# Patient Record
Sex: Female | Born: 1965 | Race: White | Hispanic: No | Marital: Single | Smoking: Current every day smoker
Health system: Southern US, Community
[De-identification: ages and names within clinical notes are randomized; demographics above are authoritative.]

## PROBLEM LIST (undated history)

## (undated) DIAGNOSIS — M5412 Radiculopathy, cervical region: Secondary | ICD-10-CM

## (undated) DIAGNOSIS — M545 Low back pain, unspecified: Secondary | ICD-10-CM

## (undated) DIAGNOSIS — F32A Depression, unspecified: Secondary | ICD-10-CM

## (undated) DIAGNOSIS — K219 Gastro-esophageal reflux disease without esophagitis: Secondary | ICD-10-CM

## (undated) DIAGNOSIS — N6459 Other signs and symptoms in breast: Secondary | ICD-10-CM

## (undated) DIAGNOSIS — F419 Anxiety disorder, unspecified: Secondary | ICD-10-CM

## (undated) DIAGNOSIS — R51 Headache: Secondary | ICD-10-CM

## (undated) DIAGNOSIS — F329 Major depressive disorder, single episode, unspecified: Secondary | ICD-10-CM

## (undated) DIAGNOSIS — E785 Hyperlipidemia, unspecified: Secondary | ICD-10-CM

## (undated) HISTORY — PX: ROTATOR CUFF REPAIR: SHX139

## (undated) HISTORY — DX: Radiculopathy, cervical region: M54.12

## (undated) HISTORY — DX: Low back pain: M54.5

## (undated) HISTORY — DX: Major depressive disorder, single episode, unspecified: F32.9

## (undated) HISTORY — PX: FOOT SURGERY: SHX648

## (undated) HISTORY — DX: Hyperlipidemia, unspecified: E78.5

## (undated) HISTORY — DX: Low back pain, unspecified: M54.50

## (undated) HISTORY — DX: Depression, unspecified: F32.A

---
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
## 1999-02-27 ENCOUNTER — Other Ambulatory Visit: Admission: RE | Admit: 1999-02-27 | Discharge: 1999-02-27 | Payer: Self-pay | Admitting: Obstetrics and Gynecology

## 2000-02-27 ENCOUNTER — Other Ambulatory Visit: Admission: RE | Admit: 2000-02-27 | Discharge: 2000-02-27 | Payer: Self-pay | Admitting: Obstetrics and Gynecology

## 2001-01-01 ENCOUNTER — Other Ambulatory Visit: Admission: RE | Admit: 2001-01-01 | Discharge: 2001-01-01 | Payer: Self-pay | Admitting: Obstetrics and Gynecology

## 2002-01-21 ENCOUNTER — Other Ambulatory Visit: Admission: RE | Admit: 2002-01-21 | Discharge: 2002-01-21 | Payer: Self-pay | Admitting: Obstetrics and Gynecology

## 2003-01-28 ENCOUNTER — Other Ambulatory Visit: Admission: RE | Admit: 2003-01-28 | Discharge: 2003-01-28 | Payer: Self-pay | Admitting: Obstetrics and Gynecology

## 2003-08-31 ENCOUNTER — Encounter: Payer: Self-pay | Admitting: Internal Medicine

## 2003-08-31 ENCOUNTER — Ambulatory Visit (HOSPITAL_COMMUNITY): Admission: RE | Admit: 2003-08-31 | Discharge: 2003-08-31 | Payer: Self-pay | Admitting: Internal Medicine

## 2003-09-27 ENCOUNTER — Ambulatory Visit (HOSPITAL_COMMUNITY): Admission: RE | Admit: 2003-09-27 | Discharge: 2003-09-27 | Payer: Self-pay | Admitting: Internal Medicine

## 2003-09-27 ENCOUNTER — Encounter: Payer: Self-pay | Admitting: Internal Medicine

## 2006-01-17 ENCOUNTER — Ambulatory Visit (HOSPITAL_COMMUNITY): Admission: RE | Admit: 2006-01-17 | Discharge: 2006-01-17 | Payer: Self-pay | Admitting: Family Medicine

## 2006-08-07 ENCOUNTER — Encounter: Admission: RE | Admit: 2006-08-07 | Discharge: 2006-08-07 | Payer: Self-pay | Admitting: Family Medicine

## 2008-03-24 ENCOUNTER — Ambulatory Visit (HOSPITAL_COMMUNITY): Admission: RE | Admit: 2008-03-24 | Discharge: 2008-03-24 | Payer: Self-pay | Admitting: Family Medicine

## 2009-03-29 ENCOUNTER — Ambulatory Visit (HOSPITAL_COMMUNITY): Admission: RE | Admit: 2009-03-29 | Discharge: 2009-03-29 | Payer: Self-pay | Admitting: Family Medicine

## 2009-04-28 ENCOUNTER — Ambulatory Visit: Payer: Self-pay | Admitting: Sports Medicine

## 2009-04-28 DIAGNOSIS — M545 Low back pain: Secondary | ICD-10-CM

## 2009-04-28 DIAGNOSIS — M76899 Other specified enthesopathies of unspecified lower limb, excluding foot: Secondary | ICD-10-CM

## 2009-05-24 ENCOUNTER — Telehealth: Payer: Self-pay | Admitting: Sports Medicine

## 2009-10-03 ENCOUNTER — Encounter: Admission: RE | Admit: 2009-10-03 | Discharge: 2009-10-03 | Payer: Self-pay | Admitting: Family Medicine

## 2009-11-29 ENCOUNTER — Ambulatory Visit: Payer: Self-pay | Admitting: Family Medicine

## 2009-11-29 DIAGNOSIS — M5412 Radiculopathy, cervical region: Secondary | ICD-10-CM | POA: Insufficient documentation

## 2009-12-24 HISTORY — PX: BONE MARROW BIOPSY: SHX199

## 2009-12-27 ENCOUNTER — Ambulatory Visit: Payer: Self-pay | Admitting: Sports Medicine

## 2010-06-09 ENCOUNTER — Encounter: Admission: RE | Admit: 2010-06-09 | Discharge: 2010-06-09 | Payer: Self-pay | Admitting: Family Medicine

## 2010-12-12 ENCOUNTER — Ambulatory Visit: Payer: Self-pay | Admitting: Sports Medicine

## 2011-01-23 NOTE — Assessment & Plan Note (Signed)
Summary: f/u neck, cerv radiculopathy   Vital Signs:  Patient profile:   45 year old female BP sitting:   119 / 83  Vitals Entered By: Enid Baas MD (December 27, 2009 3:47 PM)  History of Present Illness: 45 yo F here for 1 month f/u neck pain, radiculopathy  Patient reports 50-60% improvement on neurontin over past month Some shooting pain down arms but no more numbness/tingling No longer keeping her up at night Tramadol caused severe GI reaction so added as allergy. No pain with shoulder motions and feels strength has come back in arms Still with pain mostly in right side of neck now. Doing easy ROM exercises. Had x-rays at chiropractor but copies not available.  Allergies (verified): 1)  ! Tramadol Hcl  Physical Exam  General:  Well-developed,well-nourished,in no acute distress; alert,appropriate and cooperative throughout examination Msk:  Neck: No gross deformity - mild limitation of extension but otherwise FROM. TTP mild in right paraspinal cervical region and trapezius.  Minimal left paraspinal TTP. No midline/bony TTP.   Negative spurlings MSRs 2+ and equal in biceps, triceps, and brachioradialis tendons. No numbness currently in bilateral upper extremities  Bilateral Shoulders: FROM without pain. Strength 5/5 empty can and resisted int/ext rotation Bilateral upper extremities 5/5 strength.   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  Increase to two times a day and eventually three times a day at low dose so long as doesn't make her too sleepy - tolerating this well currently.  Cont easy motion.  F/u in 2 months.  Complete Medication List: 1)  Neurontin 300 Mg Caps (Gabapentin) .Marland Kitchen.. 1 cap by mouth two times a day - can increase to three times a day if needed 2)  Celexa 20 Mg Tabs (Citalopram  hydrobromide) 3)  Clonazepam 0.5 Mg Tbdp (Clonazepam) 4)  Promethazine Hcl 25 Mg Tabs (Promethazine hcl) .... Take one tab q6 nausea  Patient Instructions: 1)  Increase neurontin to 300mg  twice a day.  We can eventually go to three times a day if you're tolerating this. 2)  Continue with range of motion exercises of your neck daily. 3)  If you can get a disk copy of your x-rays, drop that off for [place]. 4)  Follow up with [place] in 2 months. Prescriptions: NEURONTIN 300 MG CAPS (GABAPENTIN) 1 cap by mouth two times a day - can increase to three times a day if needed  #90 x 1   Entered and Authorized by:   Norton Blizzard MD   Signed by:   Norton Blizzard MD on 12/27/2009   Method used:   Electronically to        [facility]. [phone]* (retail)       [address]       [place], [place]  [address]       Ph: [phone]       Fax: [phone]   RxID:   [id number]

## 2011-01-25 NOTE — Assessment & Plan Note (Signed)
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 Tramadol Hcl 2)  ! Flagyl 3)  ! 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 1 cap by mouth two times a day - can increase to three times a day if needed 2)  Celexa 20 Mg Tabs (Citalopram hydrobromide) 3)  Clonazepam 0.5 Mg Tbdp (Clonazepam) 4)  Promethazine Hcl 25 Mg Tabs (Promethazine hcl) .... Take one tab q6 nausea 5)  Amitriptyline Hcl 25 Mg Tabs (Amitriptyline hcl) .... Take one tab qhs Prescriptions: NEURONTIN 300 MG CAPS (GABAPENTIN) 1 cap by mouth two  times a day - can increase to three times a day if needed  #90 Capsule x 6   Entered by:   Rochele Pages RN   Authorized by:   Enid Baas MD   Signed by:   Rochele Pages RN on 12/12/2010   Method used:   Electronically to        [facility]. [phone]* (retail)       [address]       [place], [place]  [address]       Ph: [phone]       Fax: [phone]   RxID:   [id number] AMITRIPTYLINE HCL 25 MG TABS (AMITRIPTYLINE HCL) take one tab qhs  #30 Tablet x 12   Entered by:   Rochele Pages RN   Authorized by:   Enid Baas MD   Signed by:   Rochele Pages RN on 12/12/2010   Method used:   Electronically to        [facility]. [phone]* (retail)       [address]       [place], [place]  [address]       Ph: [phone]       Fax: [phone]   RxID:   [id number]  Orders Added: 1)  Est. Patient Level II [78469]

## 2011-09-04 ENCOUNTER — Ambulatory Visit (INDEPENDENT_AMBULATORY_CARE_PROVIDER_SITE_OTHER): Payer: BC Managed Care – PPO | Admitting: Family Medicine

## 2011-09-04 ENCOUNTER — Encounter: Payer: Self-pay | Admitting: Family Medicine

## 2011-09-04 VITALS — BP 118/80 | HR 89 | Temp 97.9°F | Ht 62.0 in | Wt 185.0 lb

## 2011-09-04 DIAGNOSIS — M25529 Pain in unspecified elbow: Secondary | ICD-10-CM

## 2011-09-04 DIAGNOSIS — M25522 Pain in left elbow: Secondary | ICD-10-CM

## 2011-09-04 NOTE — Patient Instructions (Signed)
You have lateral epicondylitis and developing radial tunnel syndrome. Try to avoid painful activities as much as possible. Ice the area 3-4 times a day for 15 minutes at a time. Aleve 2 tabs twice a day with food for pain and inflammation. Counterforce brace as directed can help unload area - wear this regularly if it provides you with relief. Wrist brace to help prevent wrist extension - wear as much as possible. Start physical therapy for home exercises, modalities up to 6 weeks. Consider injection for short term pain relief if the above is not helping. Surgery is a consideration if exercises and injection are not providing enough relief.

## 2011-09-06 ENCOUNTER — Encounter: Payer: Self-pay | Admitting: Family Medicine

## 2011-09-06 DIAGNOSIS — M25522 Pain in left elbow: Secondary | ICD-10-CM | POA: Insufficient documentation

## 2011-09-06 NOTE — Progress Notes (Signed)
  Subjective:    Patient ID: Gloria Haney, female    DOB: [birth year], 45 y.o.   MRN: [id number]  HPI 45 yo F here for left elbow pain.  Patient is right handed. Denies any acute injury or anything different that has contributed to this. States about 1 1/2 weeks ago started developing lateral left elbow pain radiating into forearm. Feels different than her cervical radiculopathy pain. Is associated with some numbness into all fingers dorsally. + night pain. Is on gabapentin and amitriptyline. Worse with certain wrist motions. No swelling or bruising. No prior problems with this elbow.  Past Medical History  Diagnosis Date  . Cervical radiculopathy   . Lumbago   . Hyperlipidemia   . Depression     No current outpatient prescriptions on file prior to visit.    History reviewed. No pertinent past surgical history.  Allergies  Allergen Reactions  . Metronidazole   . Sulfonamide Derivatives   . Tramadol Hcl     History   Social History  . Marital Status: Single    Spouse Name: N/A    Number of Children: N/A  . Years of Education: N/A   Occupational History  . Not on file.   Social History Main Topics  . Smoking status: Current Everyday Smoker -- 1.0 packs/day    Types: Cigarettes  . Smokeless tobacco: Not on file  . Alcohol Use: Not on file  . Drug Use: Not on file  . Sexually Active: Not on file   Other Topics Concern  . Not on file   Social History Narrative  . No narrative on file    No family history on file.  BP 118/80  Pulse 89  Temp(Src) 97.9 F (36.6 C) (Oral)  Ht 5\' 2"  (1.575 m)  Wt 185 lb (83.915 kg)  BMI 33.84 kg/m2  Review of Systems See HPI above.    Objective:   Physical Exam Gen: NAD  L elbow: No gross deformity, swelling, or bruising. TTP at lateral epicondyle and extensor mass.  No wrist or hand TTP. FROM elbow and wrist. Strength 5/5 with all elbow and wrist motions but pain on resisted wrist and 3rd digit extension at  elbow reproducing pain. Negative tinels at cubital, carpal, and radial tunnels. Subjective numbness dorsal hand and digits.     Assessment & Plan:  1. Left elbow pain - 2/2 lateral epicondylitis though she may have an element of radial tunnel as well.  Start with conservative treatment - PT, counterforce brace as well as wrist brace to help prevent wrist extension that contributes to her pain.  Aleve for pain and inflammation.  Consider cortisone injection, MRI/ultrasound if not improving as expected over the next 4-6 weeks.

## 2011-09-06 NOTE — Assessment & Plan Note (Signed)
2/2 lateral epicondylitis though she may have an element of radial tunnel as well.  Start with conservative treatment - PT, counterforce brace as well as wrist brace to help prevent wrist extension that contributes to her pain.  Aleve for pain and inflammation.  Consider cortisone injection, MRI/ultrasound if not improving as expected over the next 4-6 weeks.

## 2011-09-24 ENCOUNTER — Other Ambulatory Visit: Payer: Self-pay | Admitting: Obstetrics and Gynecology

## 2011-09-25 ENCOUNTER — Encounter (HOSPITAL_COMMUNITY): Payer: Self-pay | Admitting: *Deleted

## 2011-09-26 ENCOUNTER — Other Ambulatory Visit: Payer: Self-pay | Admitting: Obstetrics and Gynecology

## 2011-09-26 NOTE — H&P (Signed)
Gloria Haney, Gloria Haney               ACCOUNT NO.:  [id number]  MEDICAL RECORD NO.:  [id number]  LOCATION:  [facility]                         FACILITY:  [facility]  PHYSICIAN:  Lenoard Aden, M.D.DATE OF BIRTH:  [birth year]  DATE OF ADMISSION:  09/24/2011 DATE OF DISCHARGE:                             HISTORY & PHYSICAL   CHIEF COMPLAINT:  Menorrhagia.  HISTORY OF PRESENT ILLNESS:  A 45 year old G1, P1 with menometrorrhagia, normal ultrasound, normal labs for definitive therapy.  She has allergies to SULFA and FLAGYL.  Medications include Advair Diskus p.r.n., amitriptyline, clonazepam p.r.n., Paxil, promethazine p.r.n., simvastatin, Zymax, Ventolin, zonisamide.  Medical problems to include irregular periods as noted, migraine headaches, and asthma.  FAMILY HISTORY:  Contributory for brain cancer.  PHYSICAL EXAMINATION:  GENERAL:  She is a well-developed, well-nourished white female. VITAL SIGNS:  Height of 5 feet 2 inches, weight 187 pounds. HEENT:  Normal. NECK:  Supple.  Full range of motion. LUNGS:  Clear. HEART:  Regular rhythm. ABDOMEN:  Soft, nontender. PELVIC:  Uterus to be normal size, shape, and contour, anteflexed.  No adnexal masses. EXTREMITIES:  No cords. NEUROLOGIC:  Nonfocal. SKIN:  Intact.  IMPRESSION:  Menometrorrhagia for definitive therapy.  PLAN:  Proceed with NovaSure endometrial ablation, diagnostic hysteroscopy, D and C.  Risks of anesthesia, infection, bleeding, injury to abdominal organs, and need for repair was discussed, delayed versus immediate complications to include bowel and bladder injury noted.  The patient acknowledges and will proceed.     Lenoard Aden, M.D.     RJT/MEDQ  D:  09/26/2011  T:  09/26/2011  Job:  [id number]

## 2011-09-27 ENCOUNTER — Encounter (HOSPITAL_COMMUNITY): Payer: Self-pay | Admitting: Anesthesiology

## 2011-09-27 ENCOUNTER — Other Ambulatory Visit: Payer: Self-pay | Admitting: Obstetrics and Gynecology

## 2011-09-27 ENCOUNTER — Ambulatory Visit (HOSPITAL_COMMUNITY): Payer: BC Managed Care – PPO | Admitting: Anesthesiology

## 2011-09-27 ENCOUNTER — Encounter (HOSPITAL_COMMUNITY)
Admission: RE | Disposition: A | Discharge status: Home / Self Care | Payer: Self-pay | Source: Ambulatory Visit | Attending: Obstetrics and Gynecology

## 2011-09-27 ENCOUNTER — Ambulatory Visit (HOSPITAL_COMMUNITY)
Admission: RE | Admit: 2011-09-27 | Discharge: 2011-09-27 | Disposition: A | Discharge status: Home / Self Care | Payer: BC Managed Care – PPO | Source: Ambulatory Visit | Attending: Obstetrics and Gynecology | Admitting: Obstetrics and Gynecology

## 2011-09-27 DIAGNOSIS — N920 Excessive and frequent menstruation with regular cycle: Secondary | ICD-10-CM

## 2011-09-27 HISTORY — DX: Headache: R51

## 2011-09-27 HISTORY — DX: Anxiety disorder, unspecified: F41.9

## 2011-09-27 HISTORY — DX: Gastro-esophageal reflux disease without esophagitis: K21.9

## 2011-09-27 LAB — CBC
MCV: 92.9 fL (ref 78.0–100.0)
WBC: 13.9 10*3/uL — ABNORMAL HIGH (ref 4.0–10.5)

## 2011-09-27 SURGERY — DILATATION & CURETTAGE/HYSTEROSCOPY WITH NOVASURE ABLATION
Anesthesia: Choice | Site: Uterus | Wound class: Clean Contaminated

## 2011-09-27 MED ORDER — LACTATED RINGERS IV SOLN
INTRAVENOUS | Status: DC
  Administered 2011-09-27 (×2): via INTRAVENOUS

## 2011-09-27 MED ORDER — KETOROLAC TROMETHAMINE 60 MG/2ML IM SOLN
INTRAMUSCULAR | Status: DC | PRN
  Administered 2011-09-27: 30 mg via INTRAMUSCULAR

## 2011-09-27 MED ORDER — KETOROLAC TROMETHAMINE 30 MG/ML IJ SOLN
INTRAMUSCULAR | Status: DC | PRN
  Administered 2011-09-27: 30 mg via INTRAVENOUS

## 2011-09-27 MED ORDER — LIDOCAINE HCL (CARDIAC) 20 MG/ML IV SOLN
INTRAVENOUS | Status: DC | PRN
  Administered 2011-09-27: 100 mg via INTRAVENOUS

## 2011-09-27 MED ORDER — MIDAZOLAM HCL 5 MG/5ML IJ SOLN
INTRAMUSCULAR | Status: DC | PRN
  Administered 2011-09-27: 2 mg via INTRAVENOUS

## 2011-09-27 MED ORDER — PROPOFOL 10 MG/ML IV EMUL
INTRAVENOUS | Status: DC | PRN
  Administered 2011-09-27: 10 mg via INTRAVENOUS

## 2011-09-27 MED ORDER — MIDAZOLAM HCL 2 MG/2ML IJ SOLN
INTRAMUSCULAR | Status: AC
  Filled 2011-09-27: qty 2

## 2011-09-27 MED ORDER — OXYCODONE-ACETAMINOPHEN 5-325 MG PO TABS: 1.0000 | ORAL_TABLET | ORAL | Status: AC | PRN

## 2011-09-27 MED ORDER — GLYCOPYRROLATE 0.2 MG/ML IJ SOLN
INTRAMUSCULAR | Status: AC
  Filled 2011-09-27: qty 1

## 2011-09-27 MED ORDER — DEXAMETHASONE SODIUM PHOSPHATE 10 MG/ML IJ SOLN
INTRAMUSCULAR | Status: AC
  Filled 2011-09-27: qty 1

## 2011-09-27 MED ORDER — GLYCOPYRROLATE 0.2 MG/ML IJ SOLN
INTRAMUSCULAR | Status: DC | PRN
  Administered 2011-09-27: 0.1 mg via INTRAVENOUS

## 2011-09-27 MED ORDER — BUPIVACAINE HCL (PF) 0.25 % IJ SOLN
INTRAMUSCULAR | Status: DC | PRN
  Administered 2011-09-27: 20 mL

## 2011-09-27 MED ORDER — PROPOFOL 10 MG/ML IV EMUL
INTRAVENOUS | Status: AC
  Filled 2011-09-27: qty 50

## 2011-09-27 MED ORDER — LIDOCAINE HCL (CARDIAC) 20 MG/ML IV SOLN
INTRAVENOUS | Status: AC
  Filled 2011-09-27: qty 5

## 2011-09-27 MED ORDER — FENTANYL CITRATE 0.05 MG/ML IJ SOLN
INTRAMUSCULAR | Status: AC
  Filled 2011-09-27: qty 2

## 2011-09-27 MED ORDER — ONDANSETRON HCL 4 MG/2ML IJ SOLN
INTRAMUSCULAR | Status: AC
  Filled 2011-09-27: qty 2

## 2011-09-27 MED ORDER — LACTATED RINGERS IR SOLN
Status: DC | PRN
  Administered 2011-09-27: 3000 mL

## 2011-09-27 MED ORDER — DEXAMETHASONE SODIUM PHOSPHATE 4 MG/ML IJ SOLN
INTRAMUSCULAR | Status: DC | PRN
  Administered 2011-09-27: 10 mg via INTRAVENOUS

## 2011-09-27 MED ORDER — ONDANSETRON HCL 4 MG/2ML IJ SOLN
INTRAMUSCULAR | Status: DC | PRN
  Administered 2011-09-27: 4 mg via INTRAVENOUS

## 2011-09-27 MED ORDER — FENTANYL CITRATE 0.05 MG/ML IJ SOLN
INTRAMUSCULAR | Status: DC | PRN
  Administered 2011-09-27: 100 ug via INTRAVENOUS

## 2011-09-27 SURGICAL SUPPLY — 14 items
ABLATOR ENDOMETRIAL BIPOLAR (ABLATOR) ×2 IMPLANT
CATH ROBINSON RED A/P 16FR (CATHETERS) ×2 IMPLANT
CLOTH BEACON ORANGE TIMEOUT ST (SAFETY) ×2 IMPLANT
CONTAINER PREFILL 10% NBF 60ML (FORM) ×4 IMPLANT
GLOVE BIO SURGEON STRL SZ7.5 (GLOVE) ×4 IMPLANT
GOWN PREVENTION PLUS LG XLONG (DISPOSABLE) ×2 IMPLANT
GOWN PREVENTION PLUS XLARGE (GOWN DISPOSABLE) ×2 IMPLANT
NDL SPNL 22GX3.5 QUINCKE BK (NEEDLE) ×1 IMPLANT
NEEDLE SPNL 22GX3.5 QUINCKE BK (NEEDLE) ×2 IMPLANT
PACK HYSTEROSCOPY LF (CUSTOM PROCEDURE TRAY) ×2 IMPLANT
PAD PREP 24X48 CUFFED NSTRL (MISCELLANEOUS) ×2 IMPLANT
SYR TB 1ML 25GX5/8 (SYRINGE) ×2 IMPLANT
TOWEL OR 17X24 6PK STRL BLUE (TOWEL DISPOSABLE) ×4 IMPLANT
WATER STERILE IRR 1000ML POUR (IV SOLUTION) ×2 IMPLANT

## 2011-09-27 NOTE — Anesthesia Procedure Notes (Signed)
Procedure Name: LMA Insertion Date/Time: 09/27/2011 12:51 PM Performed by: Karleen Dolphin Pre-anesthesia Checklist: Patient identified, Patient being monitored, Emergency Drugs available, Timeout performed and Suction available Patient Re-evaluated:Patient Re-evaluated prior to inductionOxygen Delivery Method: Circle System Utilized Preoxygenation: Pre-oxygenation with 100% oxygen Intubation Type: IV induction LMA: LMA inserted LMA Size: 4.0 Number of attempts: 1 Placement Confirmation: positive ETCO2 and breath sounds checked- equal and bilateral Tube secured with: Tape Dental Injury: Teeth and Oropharynx as per pre-operative assessment

## 2011-09-27 NOTE — Op Note (Signed)
Gloria Haney, Gloria Haney               ACCOUNT NO.:  [id number]  MEDICAL RECORD NO.:  [id number]  LOCATION:  [facility]                          FACILITY:  [facility]  PHYSICIAN:  Lenoard Aden, M.D.DATE OF BIRTH:  [birth year]  DATE OF PROCEDURE:  09/27/2011 DATE OF DISCHARGE:  09/27/2011                              OPERATIVE REPORT   DESCRIPTION OF PROCEDURE:  After being apprised of risks of anesthesia, infection, bleeding, injury to abdominal organs, need for repair delayed versus immediate complications include bowel and bladder injury, possible need for repair, possible uterine perforation, need for repair, the patient was brought to the operating room where she was administered general anesthetic without complications, prepped and draped in usual sterile fashion, catheterized until the bladder was empty.  Exam under anesthesia reveals a small anteflexed uterus and no adnexal masses. Speculum placed.  Dilute paracervical block with a dilute Marcaine solution 20 mL total placed in the standard fashion.  After achieving adequate anesthesia, cervix was grasped using a single-tooth tenaculum and dilated easily up to a #23 Pratt dilator.  NovaSure measurements were taken.  Visualization was a normal endometrial cavity.  Bilateral normal tubal ostia.  No evidence of uterine perforation.  The NovaSure device was then opened after endometrial curettings were collected using sharp curettage in a 4 quadrant method.  The NovaSure device is set to a length of 6.5 and a width of 2.9, seated in the appropriate fashion. CO2 test was performed and was negative.  The NovaSure procedure was then initiated for 59 seconds for a completed ablation.  The device was then removed, inspected and found to be intact.  Revisualization hysteroscopically reveals endometrial cavity to be within normal limits, well ablated and no evidence of uterine perforation.  At this time, the patient is awakened and transferred to  recovery in good condition after all instruments were removed.     Lenoard Aden, M.D.     RJT/MEDQ  D:  09/27/2011  T:  09/27/2011  Job:  [id number]

## 2011-09-27 NOTE — Anesthesia Preprocedure Evaluation (Signed)
Anesthesia Evaluation  Name, MR# and DOB Patient awake  General Assessment Comment  Reviewed: Allergy & Precautions, H&P , NPO status , Patient's Chart, lab work & pertinent test results  Airway Mallampati: II TM Distance: >3 FB Neck ROM: Full    Dental No notable dental hx. 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    GI/Hepatic negative GI ROS Neg liver ROS    Endo/Other  Negative Endocrine ROS  Renal/GU negative Renal ROS  Genitourinary negative   Musculoskeletal negative musculoskeletal ROS (+)   Abdominal   Peds  Hematology negative hematology ROS (+)   Anesthesia Other Findings   Reproductive/Obstetrics negative OB ROS                           Anesthesia Physical Anesthesia Plan  ASA: II  Anesthesia Plan: General   Post-op Pain Management:    Induction: Intravenous  Airway Management Planned: LMA  Additional Equipment:   Intra-op Plan:   Post-operative Plan: Extubation in OR  Informed Consent: I have reviewed the patients History and Physical, chart, labs and discussed the procedure including the risks, benefits and alternatives for the proposed anesthesia with the patient or authorized representative who has indicated his/her understanding and acceptance.   Dental advisory given  Plan Discussed with: Anesthesiologist  Anesthesia Plan Comments:         Anesthesia Quick Evaluation

## 2011-09-27 NOTE — Op Note (Signed)
[name], Gloria Haney               ACCOUNT NO.:  [id number]  MEDICAL RECORD NO.:  [id number]  LOCATION:  [facility]                          [facility]ILITY:  [facility]  PHYSICIAN:  Lenoard Aden, M.D.DATE OF BIRTH:  1966-01-26  DATE OF PROCEDURE: DATE OF DISCHARGE:  09/27/2011                              OPERATIVE REPORT   PREOPERATIVE DIAGNOSIS:  Refractory dysfunctional uterine bleeding.  POSTOPERATIVE DIAGNOSIS:  Refractory dysfunctional uterine bleeding.  PROCEDURE:  Diagnostic gastroscopy, D and C, NovaSure endometrial ablation.  SURGEON:  Lenoard Aden, MD  ASSISTANT:  None.  ANESTHESIA:  General and local.  Dictation ended at this point.     Lenoard Aden, M.D.     RJT/MEDQ  D:  09/27/2011  T:  09/27/2011  Job:  [id number]

## 2011-09-27 NOTE — Progress Notes (Signed)
  No changes noted. Consent done. 

## 2011-09-27 NOTE — Transfer of Care (Signed)
Immediate Anesthesia Transfer of Care Note  Patient: Gloria Haney  Procedure(s) Performed:  DILATATION & CURETTAGE/HYSTEROSCOPY WITH NOVASURE ABLATION  Patient Location: PACU  Anesthesia Type: General  Level of Consciousness: awake, alert  and oriented  Airway & Oxygen Therapy: Patient Spontanous Breathing and Patient connected to nasal cannula oxygen  Post-op Assessment: Report given to PACU RN and Post -op Vital signs reviewed and stable  Post vital signs: Reviewed and stable  Complications: No apparent anesthesia complications

## 2011-09-27 NOTE — Op Note (Signed)
09/27/2011  1:13 PM  PATIENT:  Gloria Haney  45 y.o. female  PRE-OPERATIVE DIAGNOSIS:  Dysfunctional Uterine Bleeding(Refractory)  POST-OPERATIVE DIAGNOSIS:  Dysfunctional Uterine Bleeding  PROCEDURE:  Procedure(s): DILATATION & CURETTAGE/HYSTEROSCOPY WITH NOVASURE ABLATION  SURGEON:  Surgeon(s): Lenoard Aden, MD  PHYSICIAN ASSISTANT:   ASSISTANTS: none   ANESTHESIA:   local and general  ESTIMATED BLOOD LOSS: * No blood loss amount entered *   BLOOD ADMINISTERED:none  DRAINS: none   LOCAL MEDICATIONS USED:  MARCAINE 20CC  SPECIMEN:  Source of Specimen:  [facility]  DISPOSITION OF SPECIMEN:  PATHOLOGY  COUNTS:  YES  TOURNIQUET:  * No tourniquets in log *  DICTATION #:[id number]  PLAN OF CARE: DC home  PATIENT DISPOSITION:  PACU - hemodynamically stable.

## 2011-09-28 NOTE — Anesthesia Postprocedure Evaluation (Signed)
  Anesthesia Post-op Note  Patient: Gloria Haney  Procedure(s) Performed:  DILATATION & CURETTAGE/HYSTEROSCOPY WITH NOVASURE ABLATION  Patient Location: PACU  Anesthesia Type: General  Level of Consciousness: awake, alert  and oriented  Airway and Oxygen Therapy: Patient Spontanous Breathing  Post-op Pain: none  Post-op Assessment: Post-op Vital signs reviewed, Patient's Cardiovascular Status Stable, Respiratory Function Stable, Patent Airway, No signs of Nausea or vomiting and Pain level controlled  Post-op Vital Signs: Reviewed and stable  Complications: No apparent anesthesia complications

## 2011-11-13 ENCOUNTER — Ambulatory Visit: Payer: BC Managed Care – PPO | Admitting: Sports Medicine

## 2012-02-21 ENCOUNTER — Other Ambulatory Visit: Payer: Self-pay | Admitting: Sports Medicine

## 2012-02-22 ENCOUNTER — Other Ambulatory Visit: Payer: Self-pay | Admitting: *Deleted

## 2012-02-22 MED ORDER — AMITRIPTYLINE HCL 25 MG PO TABS: 25.0000 mg | ORAL_TABLET | Freq: Every day | ORAL | Status: DC

## 2012-02-22 MED ORDER — GABAPENTIN 300 MG PO CAPS: 300.0000 mg | ORAL_CAPSULE | Freq: Three times a day (TID) | ORAL | Status: DC

## 2012-02-22 NOTE — Progress Notes (Signed)
Refills sent to [facility] [address] per pt.  States she is still taking amitriptyline 25 mg qhs and gabapentin 300 mg bid-tid.  Advised her to schedule f/u in the next few months as Dr. Darrick Penna has not seen her since 11/2010- pt agreeable.

## 2012-05-28 ENCOUNTER — Ambulatory Visit (INDEPENDENT_AMBULATORY_CARE_PROVIDER_SITE_OTHER): Payer: BC Managed Care – PPO | Admitting: Sports Medicine

## 2012-05-28 VITALS — BP 105/73

## 2012-05-28 DIAGNOSIS — M5412 Radiculopathy, cervical region: Secondary | ICD-10-CM

## 2012-05-28 MED ORDER — GABAPENTIN 300 MG PO CAPS: 300.0000 mg | ORAL_CAPSULE | Freq: Three times a day (TID) | ORAL | Status: AC

## 2012-05-28 MED ORDER — AMITRIPTYLINE HCL 25 MG PO TABS: 25.0000 mg | ORAL_TABLET | Freq: Every day | ORAL | Status: DC

## 2012-05-28 NOTE — Assessment & Plan Note (Signed)
The patient seems to be totally stable from her left-sided radiculopathy that is thought to relate cervical spine DJD  We continued her on the same medications  If she continues doing so well she should consider whether she wants to wean the dose of gabapentin somewhat  I am happy to see her for this condition but if she wishes to have her medications refilled for this condition by her family physician she can return to see me as needed.

## 2012-05-28 NOTE — Progress Notes (Signed)
  Subjective:    Patient ID: Gloria Haney, female    DOB: 1966-11-16, 46 y.o.   MRN: [id number]  HPI  Patient enters for refill of her amitriptyline and gabapentin. She sees Dr. Meridee Score for primary care. She is generally been doing well but does take medicines for high cholesterol and for asthma. Since we treated her radicular symptoms she has almost no pain or tingling that goes into her trapezius or down into her arms. 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    Review of Systems     Objective:   Physical Exam  In no acute distress   posture is improved versus last visit and she has a normal head position  Full range of motion of the neck  Testing of C5-T1 reveals normal strength and normal sensation  Normal biceps and triceps reflexes      Assessment & Plan:

## 2012-08-07 ENCOUNTER — Ambulatory Visit: Payer: BC Managed Care – PPO | Admitting: Sports Medicine

## 2012-10-21 ENCOUNTER — Other Ambulatory Visit: Payer: Self-pay | Admitting: Sports Medicine

## 2013-03-11 ENCOUNTER — Encounter: Payer: Self-pay | Admitting: Sports Medicine

## 2013-03-11 ENCOUNTER — Ambulatory Visit (INDEPENDENT_AMBULATORY_CARE_PROVIDER_SITE_OTHER): Payer: BC Managed Care – PPO | Admitting: Sports Medicine

## 2013-03-11 VITALS — BP 120/89 | HR 112 | Ht 62.0 in | Wt 185.0 lb

## 2013-03-11 DIAGNOSIS — M25569 Pain in unspecified knee: Secondary | ICD-10-CM

## 2013-03-11 DIAGNOSIS — M25562 Pain in left knee: Secondary | ICD-10-CM

## 2013-03-11 MED ORDER — MELOXICAM 15 MG PO TABS: ORAL_TABLET | ORAL | Status: AC

## 2013-03-11 NOTE — Progress Notes (Signed)
  Subjective:    Patient ID: Gloria Haney, female    DOB: 09-20-1966, 47 y.o.   MRN: [id number]  HPI chief complaint: Left knee pain  47 year old female comes in today complaining of left knee pain. Pain has been present now for about 2 months. She does not recall any specific injury. She first noticed the knee popping with going up and down stairs and she developed an aching discomfort along the medial aspect of the knee shortly thereafter. She was seen at [facility] and x-rays of her knee were done. She was given a cortisone injection but that did not improve her pain. Her pain is worse with weightbearing but it is intermittent. She denies any locking of the knee. She's not noticed any swelling. No prior knee surgery. No associated numbness or tingling in the legs. The knee does want to give way from time to time.  Past medical history is reviewed Medications are reviewed Surgical history is significant for an excision of a bunionette off of the left foot Allergies are reviewed She smokes one pack of cigarettes a day, denies alcohol use    Review of Systems     Objective:   Physical Exam Well-developed, well-nourished. No acute distress. Awake alert and oriented x3. Vital signs are reviewed  Left knee: Full range of motion. No effusion. There is tenderness to palpation along the medial joint line with a positive Thessaly's. Pain but no popping with McMurray's. No tenderness along the lateral joint line. No patellofemoral crepitus. No pain with patellar compression. Knee is stable to valgus and varus stressing. Negative anterior drawer. Negative posterior drawer. Neurovascularly intact distally. She walks with a slight limp.  X-rays from [facility] dated 02/18/2013 are reviewed. There is a paucity of degenerative changes. Nothing acute.  [facility] ultrasound of the left knee: Limited views of the medial meniscus were obtained. There is a faint hyperechoic area  through the posterior horn which could suggest calcification from a previous injury. No discrete tear is seen.       Assessment & Plan:  1. Left knee pain likely secondary to degenerative medial meniscal tear  Although the ultrasound does not show a definitive tear clinically I am concerned she may have a degenerative medial meniscal tear. She is wanting to avoid surgery if possible. I recommended Mobic 15 mg daily with food for 7 days then when necessary. Body helix knee sleeve for compression. Home exercise program consisting of open chain quad and hamstring strengthening. Followup in 4 weeks. If symptoms persist we may need to consider merits of further diagnostic imaging to rule out an occult medial meniscal tear. Call with questions or concerns in the interim.

## 2013-04-13 ENCOUNTER — Ambulatory Visit: Payer: BC Managed Care – PPO | Admitting: Sports Medicine

## 2013-04-14 ENCOUNTER — Ambulatory Visit (INDEPENDENT_AMBULATORY_CARE_PROVIDER_SITE_OTHER): Payer: BC Managed Care – PPO | Admitting: Sports Medicine

## 2013-04-14 ENCOUNTER — Encounter: Payer: Self-pay | Admitting: Sports Medicine

## 2013-04-14 VITALS — BP 128/88 | HR 99 | Ht 62.0 in | Wt 185.0 lb

## 2013-04-14 DIAGNOSIS — M65839 Other synovitis and tenosynovitis, unspecified forearm: Secondary | ICD-10-CM

## 2013-04-14 DIAGNOSIS — M25569 Pain in unspecified knee: Secondary | ICD-10-CM

## 2013-04-14 DIAGNOSIS — M25562 Pain in left knee: Secondary | ICD-10-CM

## 2013-04-14 DIAGNOSIS — M659 Synovitis and tenosynovitis, unspecified: Secondary | ICD-10-CM

## 2013-04-14 NOTE — Progress Notes (Signed)
  Subjective:    Patient ID: Gloria Haney, female    DOB: [birth year], 47 y.o.   MRN: [id number]  HPI Patient comes in today for followup on her left knee. She is still experiencing intermittent medial sided left knee pain but feels like it is somewhat improved from her last visit. The body helix sleeve was a little too tight so she has modified it just a bit. He is wearing a bulky double hinged upright brace on top of it which is causing her some discomfort. She denies any swelling. Gets occasional popping and catching but admits it is nontender. An ultrasound of the left knee done at her last visit showed changes in the posterior horn of the medial meniscus which were concerning for a possible degenerative meniscal tear here.  She is also complaining of pain and swelling in the second MTP joint of her right hand. She was recently at the beach and denies any trauma but began to notice acute pain and swelling in the MCP joint of her index finger. It is not terribly painful. She denies prior occurrences. No fevers or chills.    Review of Systems     Objective:   Physical Exam Well-developed, well-nourished. No acute distress. Awake alert and oriented x3  Right hand: The second MTP joint demonstrates a mild amount of swelling. No erythema. Area is slightly tender to palpation. Patient is able to make a full fist without difficulty. There is no ecchymosis. Neurovascularly intact distally.  Left knee: Full motion. No effusion. There is tenderness to palpation along the medial joint line. Pain but no popping with McMurray's. Positive Thessaly's. Good joint stability. Neurovascularly intact distally. Walking without significant limp.      Assessment & Plan:  1. Persistent left knee pain likely secondary to degenerative medial meniscal tear 2. Right second MCP joint synovitis  Patient has recently returned from a trip to [facility] where she did quite a bit of walking. Despite that, she admits  that her left knee pain is somewhat improved. She will discontinue the double of right brace and instead we'll use a simple Ace wrap over top the modified body helix sleeve (which she has cut in half). She has not been very diligent in doing her home exercises so I have reiterated the importance of doing these. For the second MCP joint synovitis, I recommended that she resume her Mobic 15 mg daily for the next 3-4 days. If symptoms persist or worsen she will let me know. Otherwise, I will see her back in 4 weeks for reevaluation.

## 2013-05-11 ENCOUNTER — Ambulatory Visit (INDEPENDENT_AMBULATORY_CARE_PROVIDER_SITE_OTHER): Payer: BC Managed Care – PPO | Admitting: Sports Medicine

## 2013-05-11 VITALS — BP 131/92 | Ht 62.0 in | Wt 195.0 lb

## 2013-05-11 DIAGNOSIS — M25569 Pain in unspecified knee: Secondary | ICD-10-CM

## 2013-05-11 DIAGNOSIS — M25562 Pain in left knee: Secondary | ICD-10-CM

## 2013-05-11 NOTE — Progress Notes (Signed)
  Subjective:    Patient ID: Gloria Haney, female    DOB: 29-May-1966, 47 y.o.   MRN: [id number]  HPI Patient comes in today for followup on her left knee pain. Overall, her pain is about the same as it was on her previous visit but it is tolerable. He is very intermittent. No swelling. No mechanical symptoms. He continues to wear her compression sleeve on a when necessary basis and states that she is doing her home exercises.    Review of Systems     Objective:   Physical Exam Well-developed, well-nourished no acute distress  Left knee: Full range of motion. No effusion. Still some slight tenderness along the medial joint line but a negative McMurray's. No tenderness along the lateral joint line. Good ligamentous stability. Neurovascular intact distally. Walking without a limp.       Assessment & Plan:  1. Left knee pain likely secondary to degenerative medial meniscal tear  Patient symptoms are tolerable. She is not interested in anything aggressive such as arthroscopy. Therefore, I think we can hold on further diagnostic imaging. Prior x-rays showed a paucity of degenerative changes. She's had a single intra-articular cortisone injection as well as oral anti-inflammatories. Overall, her symptoms have improved. I explained to her that if symptoms worsen or begin to interfere with her quality of life we could always reconsider the merits of further diagnostic imaging to rule out a medial meniscal tear. Followup when necessary.

## 2014-10-14 ENCOUNTER — Ambulatory Visit: Payer: Self-pay | Admitting: Podiatry

## 2015-03-11 ENCOUNTER — Other Ambulatory Visit (HOSPITAL_COMMUNITY): Payer: Self-pay | Admitting: Family Medicine

## 2015-03-11 DIAGNOSIS — Z1231 Encounter for screening mammogram for malignant neoplasm of breast: Secondary | ICD-10-CM

## 2015-03-17 ENCOUNTER — Ambulatory Visit (HOSPITAL_COMMUNITY)
Admission: RE | Admit: 2015-03-17 | Discharge: 2015-03-17 | Disposition: A | Discharge status: Home / Self Care | Payer: BLUE CROSS/BLUE SHIELD | Source: Ambulatory Visit | Attending: Family Medicine | Admitting: Family Medicine

## 2015-03-17 DIAGNOSIS — Z1231 Encounter for screening mammogram for malignant neoplasm of breast: Secondary | ICD-10-CM | POA: Diagnosis present

## 2015-03-18 ENCOUNTER — Other Ambulatory Visit: Payer: Self-pay | Admitting: Family Medicine

## 2015-03-18 DIAGNOSIS — R928 Other abnormal and inconclusive findings on diagnostic imaging of breast: Secondary | ICD-10-CM

## 2015-03-23 ENCOUNTER — Ambulatory Visit
Admission: RE | Admit: 2015-03-23 | Discharge: 2015-03-23 | Disposition: A | Discharge status: Home / Self Care | Payer: BLUE CROSS/BLUE SHIELD | Source: Ambulatory Visit | Attending: Family Medicine | Admitting: Family Medicine

## 2015-03-23 DIAGNOSIS — R928 Other abnormal and inconclusive findings on diagnostic imaging of breast: Secondary | ICD-10-CM

## 2016-06-07 ENCOUNTER — Other Ambulatory Visit: Payer: Self-pay | Admitting: Family Medicine

## 2016-06-07 DIAGNOSIS — Z1231 Encounter for screening mammogram for malignant neoplasm of breast: Secondary | ICD-10-CM

## 2016-06-15 ENCOUNTER — Ambulatory Visit
Admission: RE | Admit: 2016-06-15 | Discharge: 2016-06-15 | Disposition: A | Discharge status: Home / Self Care | Payer: BLUE CROSS/BLUE SHIELD | Source: Ambulatory Visit | Attending: Family Medicine | Admitting: Family Medicine

## 2016-06-15 DIAGNOSIS — Z1231 Encounter for screening mammogram for malignant neoplasm of breast: Secondary | ICD-10-CM

## 2017-05-31 ENCOUNTER — Other Ambulatory Visit: Payer: Self-pay | Admitting: Family Medicine

## 2017-05-31 DIAGNOSIS — Z1231 Encounter for screening mammogram for malignant neoplasm of breast: Secondary | ICD-10-CM

## 2017-06-18 ENCOUNTER — Ambulatory Visit
Admission: RE | Admit: 2017-06-18 | Discharge: 2017-06-18 | Disposition: A | Discharge status: Home / Self Care | Payer: BLUE CROSS/BLUE SHIELD | Source: Ambulatory Visit | Attending: Family Medicine | Admitting: Family Medicine

## 2017-06-18 DIAGNOSIS — Z1231 Encounter for screening mammogram for malignant neoplasm of breast: Secondary | ICD-10-CM

## 2017-06-19 ENCOUNTER — Ambulatory Visit: Payer: BLUE CROSS/BLUE SHIELD

## 2017-07-17 ENCOUNTER — Ambulatory Visit
Admission: RE | Admit: 2017-07-17 | Discharge: 2017-07-17 | Disposition: A | Discharge status: Home / Self Care | Payer: BLUE CROSS/BLUE SHIELD | Source: Ambulatory Visit | Attending: Sports Medicine | Admitting: Sports Medicine

## 2017-07-17 ENCOUNTER — Ambulatory Visit (INDEPENDENT_AMBULATORY_CARE_PROVIDER_SITE_OTHER): Payer: BLUE CROSS/BLUE SHIELD | Admitting: Family Medicine

## 2017-07-17 VITALS — BP 118/82 | Ht 62.0 in | Wt 167.0 lb

## 2017-07-17 DIAGNOSIS — M25512 Pain in left shoulder: Secondary | ICD-10-CM | POA: Diagnosis not present

## 2017-07-17 NOTE — Progress Notes (Signed)
Chief complaint:  Left shoulder pain 1 month  History of present illness: Gloria Haney is a 51 year old Caucasian female, with past medical history notable for anxiety, depression, hyperlipidemia, GERD, and cervical radiculopathy, who presents to the sports medicine office today with chief complaint of left shoulder pain. She reports that symptoms have been present for approximately 1 month now. She does not report of any inciting incident, trauma, or injury to explain the pain. She reports pain on lateral aspect of her left shoulder and arm. She describes the pain as a "deep ache" and throbbing pain. She reports occasional numbness and tingling in the upper arm, but believes this is from her previous diagnosis of cervical radiculopathy. She does not report of any neck pain or elbow pain. She reports any type of shoulder abduction and external rotation are aggravating factors. She reports that she is on gabapentin 300 mg 3 times a day and amitriptyline 25 mg nightly for cervical radiculopathy, which has helped somewhat. She reports that she has tried Advil and Aleve, which has caused GI upset. She reports otherwise she has tried Tylenol, with slight improvement in symptoms. She has not reported any weakness in the left arm. She does not report of any warmth, erythema, ecchymosis, or effusion. She does report of popping, locking, and catching sensation. She has additionally tried Tylenol, with slight improvement in symptoms. She is right hand dominant. She does like to crochet. She does not report of any previous injury to her left shoulder. She does not report of any fevers, chills, or night sweats.   Past medical history: Anxiety Depression GERD Hyperlipidemia Cervical radiculopathy  Past surgical history: Foot surgery Wisdom tooth extraction  Family history: No reported family history of hypertension, type 2 diabetes, hyperlipidemia   Social history: She does report of current tobacco use, 1  pack per day, she does not report of any alcohol or illicit drug use   Allergies: Flagyl Sulfa Tramadol  Review of systems: As stated above  Physical exam: Vital signs reviewed and are documented in chart General: Alert, oriented, appears stated age, in no apparent distress HEENT: Moist oral mucosa Respiratory: Normal respirations, able to speak in full sentences Cardiac: Normal peripheral pulses, regular rate Integumentary: No rashes on visible skin Neurologic: Strength 4+/5 in left upper extremity with shoulder abduction and shoulder external range of motion testing, strength limited secondary to pain, otherwise strength 5/5 in bilateral upper extremity, sensation 2+ in bilateral upper extremity Psychiatric: Appropriate affect and mood Musculoskeletal: Inspection of left shoulder reveals no obvious deformity or atrophy, no warmth, erythema, ecchymosis, or effusion noted, she is tender to palpation along head of biceps, AC joint, and lateral aspect of left upper arm along biceps and triceps region, no pain and clavicle and and cervical spine, she does have good range of motion, with forward flexion and abduction up to 180, but do notice pain when she gets to 90 of forward flexion and abduction, she does have intact shoulder internal range of motion, external ROM is at 70, impingement testing with Ananias PilgrimHawkins, Neers, and cross arm, O'Brien, speed's test positive, Yergason negative  Assessment and plan: 1. Left shoulder pain, suspect rotator cuff tendinopathy versus partial rotator cuff tear versus rotator cuff impingement   Left shoulder pain -She does have pain with shoulder abduction and shoulder external range of motion, heightening concern for supraspinatus and infraspinatus pathology, specifically tendinopathy versus partial tearing -She could also have rotator cuff impingement, as she is also somewhat sore along the [facility] -I  discussed she does need x-ray for further evaluation,  will order for x-ray to include AP, axillary, and scapular Y view -I discussed avoidance of NSAIDs, as this does cause GI upset, will not prescribe prednisone at this time, discussed she can use Tylenol as needed for pain in the interim -Did discuss home physical therapy exercises for her to do at home  Will have patient follow-up in one week, will have ultrasound session then, and potentially have steroid injection under ultrasound guidance. Otherwise, she will return here sooner as needed.  Addendum: X-rays were obtained and were personally reviewed by me, no obvious bony deformity seen in the [facility] and glenoid region, no obvious bone spurring for any osteoarthritic changes seen. Did call patient and made her aware of results. She will follow-up for ultrasound next week.  Haynes Kernshristopher Nakhi Choi, MD Primary Care Sports Medicine Fellow [facility]

## 2017-07-24 ENCOUNTER — Ambulatory Visit (INDEPENDENT_AMBULATORY_CARE_PROVIDER_SITE_OTHER): Payer: BLUE CROSS/BLUE SHIELD | Admitting: Family Medicine

## 2017-07-24 VITALS — BP 128/88 | Ht 62.0 in | Wt 166.0 lb

## 2017-07-24 DIAGNOSIS — M7542 Impingement syndrome of left shoulder: Secondary | ICD-10-CM

## 2017-07-24 NOTE — Progress Notes (Signed)
Procedure note: Gloria Haney is a 51 year old Caucasian female, with past medical history notable for anxiety, depression, hyperlipidemia, GERD, cervical radiculopathy, who presents to the sports medicine office today for ultrasound session of her left shoulder. She was seen in the office here last week for initial evaluation for left shoulder pain. Please see office note from 07/17/17 for further details of history. In the interim, she reports having 30% improvement in symptoms in the left shoulder. She reports that she is not able to take any type of NSAIDs, due to GI upset. She has been taking Tylenol intermittently, which helps slightly. She reports no interval changes in her symptoms otherwise. Still reports the pain on lateral aspect of her left shoulder and arm, aggravated by shoulder abduction and external range of motion. She describes the pain as a deep ache. She does not report of any numbness, tingling, or weakness. Did evaluate left shoulder under ultrasound today in the office. Biceps tendon, acromioclavicular joint, subscapularis, supraspinatus, infraspinatus, and glenohumeral joint were seen, without any obvious deformity, tearing, spurring, or hypoechoic changes to be concern for partial tear or a degenerative process. Mushroom sign was seen on evaluation of acromioclavicular joint, raising suspicion of possible rotator cuff impingement. Dynamic testing was done of supraspinatus, which does show catching of the supraspinatus underneath the acromion, confirming suspicion of rotator cuff impingement.  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 Discussed first option of continuing with home exercise program for her left shoulder, as this is the minstay of treatment and therapy. Given that she did have interval 30% improvement in symptoms, discussed good option would be to continue with just this alone.  Also discussed option of cortisone injection to the subacromial bursa, as well as option of formal physical therapy. Discussed at this time there is no need for any additional imaging, including MRI, as this will not change current management. After discussing and weighing options, ultimately shared agreement was made to continue with just home exercise program. She will return to the office in 3 weeks for reevaluation. If her pain does not improve in the next 3 weeks, will plan for cortisone injection at that time. If she needs to be seen sooner secondary to pain, will see her sooner and plan for subacromial cortisone injection then.  Haynes Kernshristopher Lake, M.D. Primary Care Sports Medicine Fellow [facility]

## 2017-08-07 ENCOUNTER — Ambulatory Visit (INDEPENDENT_AMBULATORY_CARE_PROVIDER_SITE_OTHER): Payer: BLUE CROSS/BLUE SHIELD | Admitting: Family Medicine

## 2017-08-07 ENCOUNTER — Encounter: Payer: Self-pay | Admitting: Family Medicine

## 2017-08-07 VITALS — BP 130/80 | Ht 62.0 in | Wt 166.0 lb

## 2017-08-07 DIAGNOSIS — M7542 Impingement syndrome of left shoulder: Secondary | ICD-10-CM

## 2017-08-07 MED ORDER — METHYLPREDNISOLONE ACETATE 40 MG/ML IJ SUSP
40.0000 mg | Freq: Once | INTRAMUSCULAR | Status: AC
  Administered 2017-08-07: 40 mg via INTRA_ARTICULAR

## 2017-08-07 NOTE — Progress Notes (Signed)
Chief complaint: Left shoulder pain 2 months  History of present illness: Gloria Haney is a 51 year old female who presents to the sports medicine office today with chief complaint of acute worsening of left shoulder pain. She reports that a few days ago she was cutting down some limbs from a tree and started to have acute pain in her left shoulder. She still reports having a deep throbbing pain along the lateral aspect of her left shoulder and arm. She reports to having occasional numbness and tingling in her left arm. She reports that she is not able to do physical therapy secondary to pain becoming worse. She cannot take any NSAIDs due to history of GI upset. She has been taking Tylenol which has not helped her. At last office visit on 07/24/17 Dr. Margaretha Sheffieldraper and I did do ultrasound imaging in the office, where dynamic testing was done the supraspinatus which did show catching of the supraspinatus underneath the acromion, confirming suspicion of rotator cuff impingement. She was given option of cortisone injection last time but she ultimately opted just to do home exercise program and was given option to return sooner if she decided on cortisone injection. She is interested today in cortisone injection into the left subacromial bursa.  Review of systems:  As stated above  Physical exam: Vital signs are reviewed and are documented in the chart Gen.: Alert, oriented, appears stated age, in no apparent distress HEENT: Moist oral mucosa Respiratory: Normal respirations, able to speak in full sentences Cardiac: Regular rate, distal pulses 2+ Integumentary: No rashes on visible skin:  Neurologic: Strength 4+/5 in left upper extremity mainly with all rotator cuff strength testing, otherwise strength 5/5 in bilateral upper extremity, sensation 2+ in bilateral upper extremity Psych: Normal affect, mood is described as good Musculoskeletal:  Inspection of left shoulder reveals no obvious deformity or muscle  atrophy, no warmth, erythema, ecchymosis, or effusion noted, she is tender today along the superior aspect of left shoulder along the distal clavicle and acromioclavicular joint, as well as the lateral aspect of left upper arm along biceps and triceps, no pain today along proximal clavicle and mid clavicular region, no pain and cervical spine, has pain with forward flexion and abduction, specifically when she gets to 90, but is able to get up fully to 180, external range of motion is at 50, internal range of motion is at 60, impingement testing positive with Hawkins, Neer, cross arm negative  Procedure note: Prior to procedure, written informed consent was obtained, outlining benefits and risks, with risks including bleeding, infection, and steroid flare. After informed consent signed by the patient, Betadine was used to clean off the skin. Alcohol was additionally used. Using 25-gauge needle 3-1 mixture of lidocaine and Depo-Medrol was used to inject the subacromial bursa from posteriolateral approach, with injection site 1 cm inferior and medial to posterior acromion. No complications were noted. Band-Aid was applied afterward. Patient was observed for 5-10 minutes following injection.  Assessment and plan: 1. Left rotator cuff impingement of supraspinatus underneath the acromion  Left rotator cuff impingement -Discussed plan of cortisone injection to the left subacromial bursa, she is agreeable to injection today -No complications noted from procedure  Discussed will have patient follow-up in 4 weeks, if she does not have any interval improvement in symptoms will plan on getting MRI of her left shoulder. Otherwise, she will return sooner as needed.   Gloria Haney, M.D. Primary Care Sports Medicine Fellow [facility]

## 2017-08-21 ENCOUNTER — Ambulatory Visit: Payer: BLUE CROSS/BLUE SHIELD | Admitting: Family Medicine

## 2017-09-04 ENCOUNTER — Ambulatory Visit: Payer: BLUE CROSS/BLUE SHIELD | Admitting: Family Medicine

## 2018-08-06 ENCOUNTER — Other Ambulatory Visit: Payer: Self-pay | Admitting: Family Medicine

## 2018-08-06 DIAGNOSIS — Z1231 Encounter for screening mammogram for malignant neoplasm of breast: Secondary | ICD-10-CM

## 2018-09-01 ENCOUNTER — Ambulatory Visit
Admission: RE | Admit: 2018-09-01 | Discharge: 2018-09-01 | Disposition: A | Discharge status: Home / Self Care | Payer: BLUE CROSS/BLUE SHIELD | Source: Ambulatory Visit | Attending: Family Medicine | Admitting: Family Medicine

## 2018-09-01 DIAGNOSIS — Z1231 Encounter for screening mammogram for malignant neoplasm of breast: Secondary | ICD-10-CM

## 2018-09-01 HISTORY — DX: Other signs and symptoms in breast: N64.59

## 2019-05-03 IMAGING — MG DIGITAL SCREENING BILATERAL MAMMOGRAM WITH TOMO AND CAD
8 series · 8 of 24 positions shown · non-contrast
Comparison: Previous exam(s).

CLINICAL DATA: Screening.

EXAM:
DIGITAL SCREENING BILATERAL MAMMOGRAM WITH TOMO AND CAD

[L CC synth-2D]
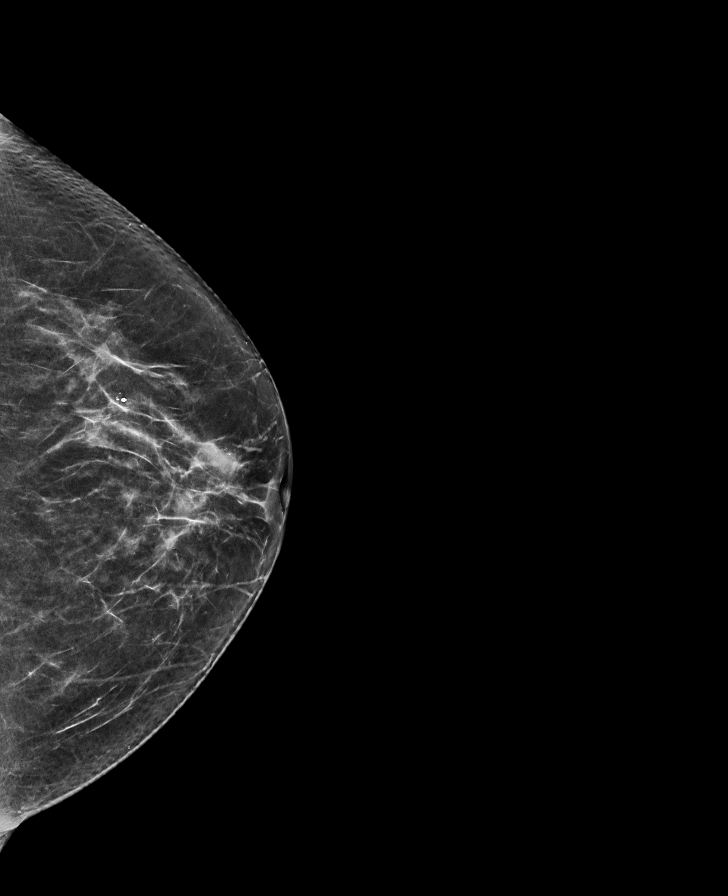

[R CC synth-2D]
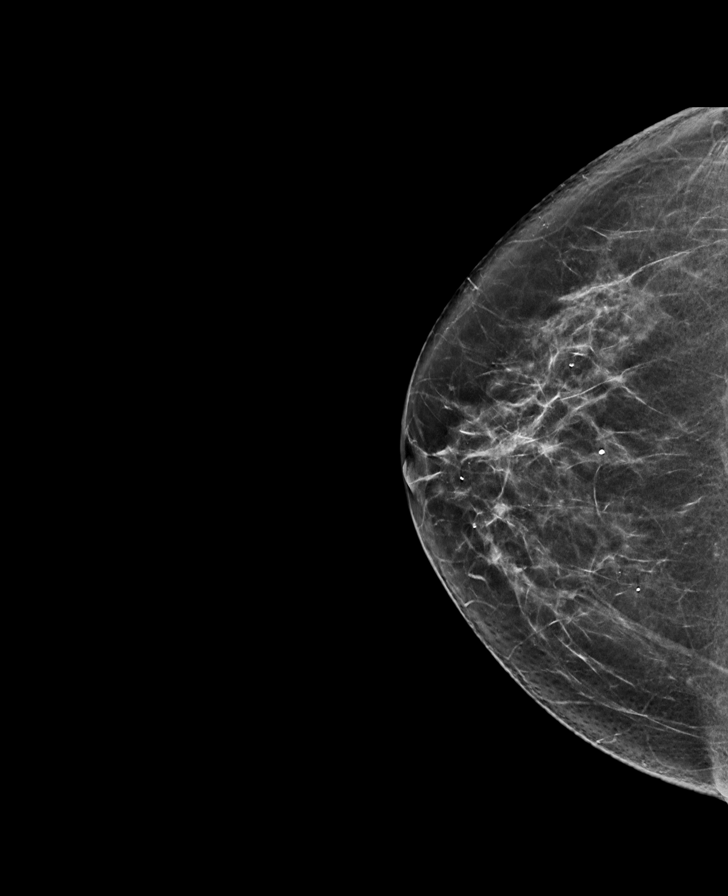

[L MLO synth-2D]
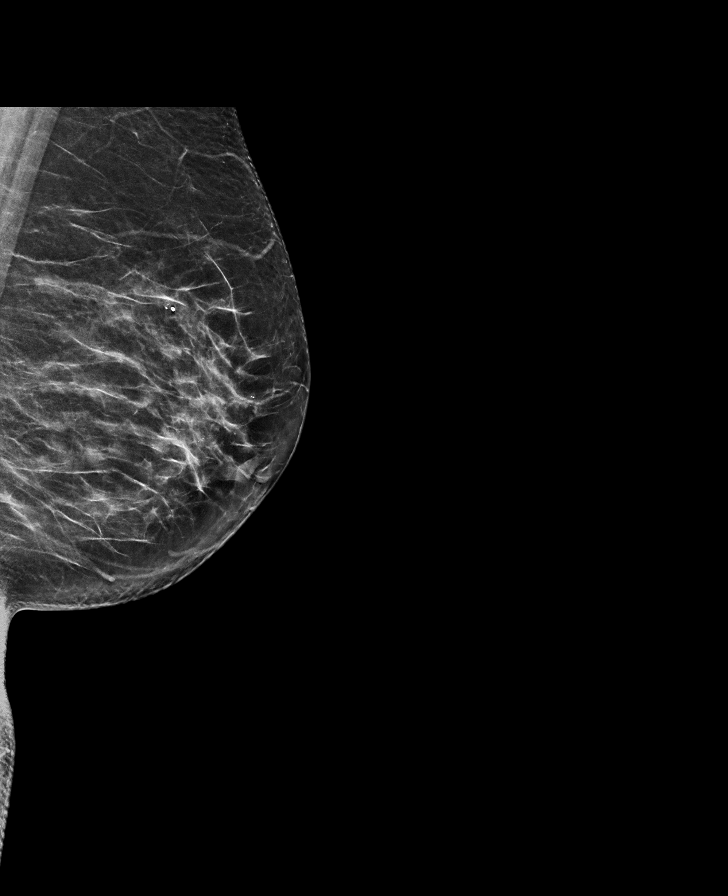

[R MLO synth-2D]
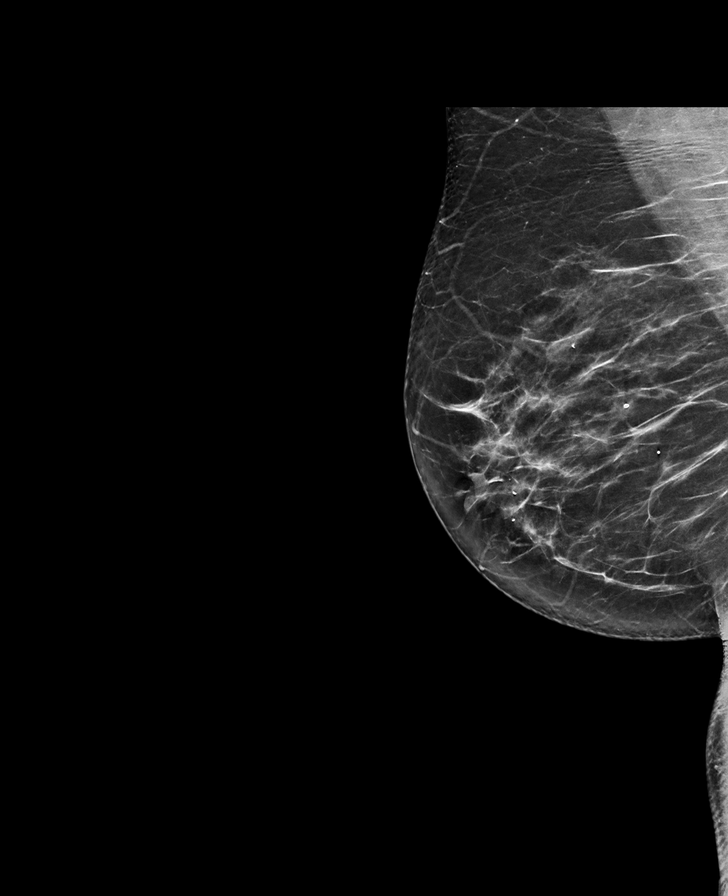

[R CC tomo · tomo slice 38/75.0]
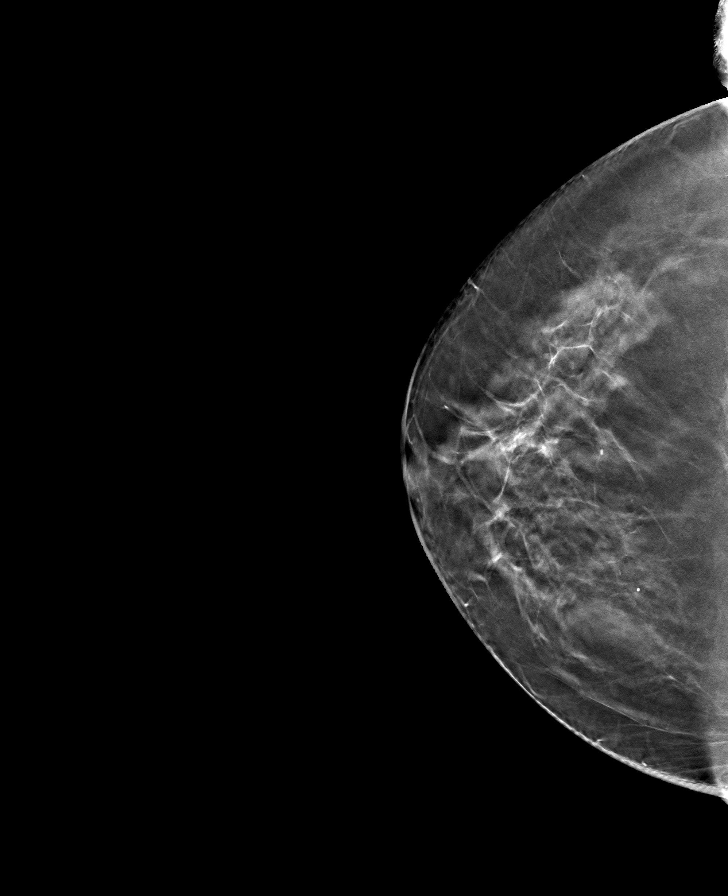

[L CC tomo · tomo slice 34/67.0]
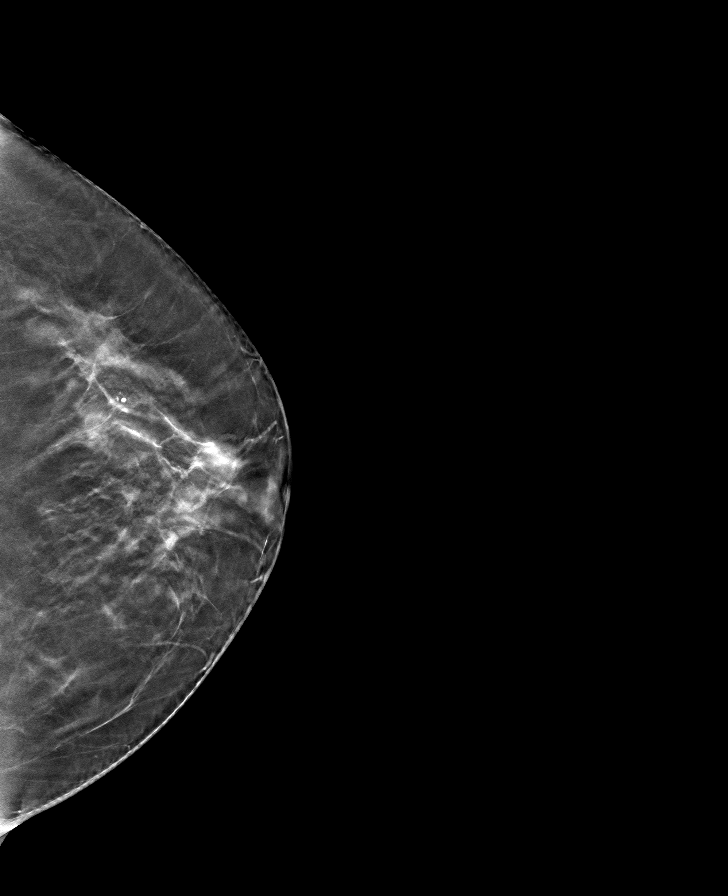

[L MLO tomo · tomo slice 37/74.0]
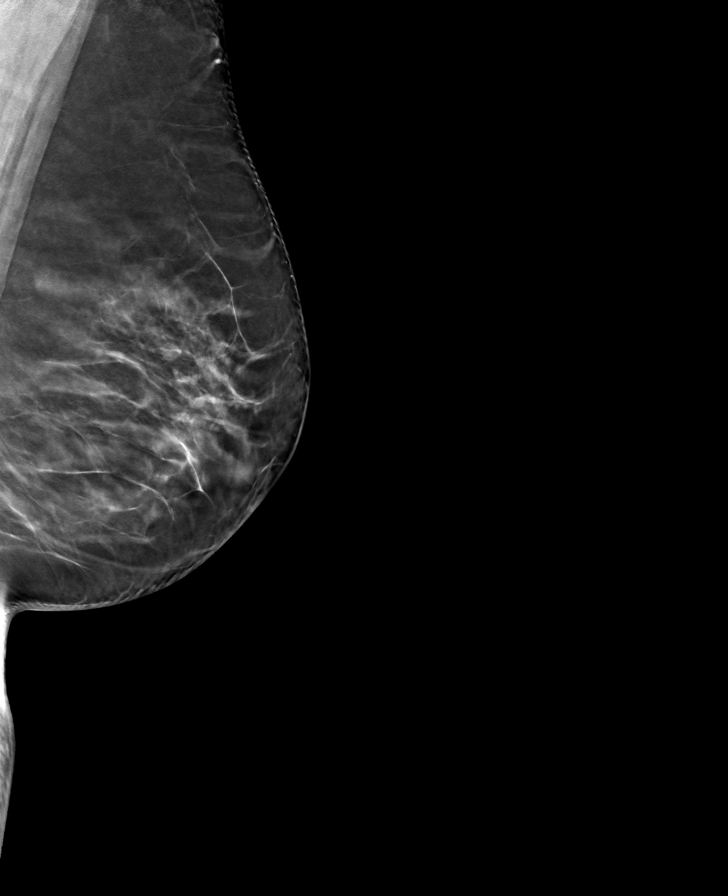

[R MLO tomo · tomo slice 39/77.0]
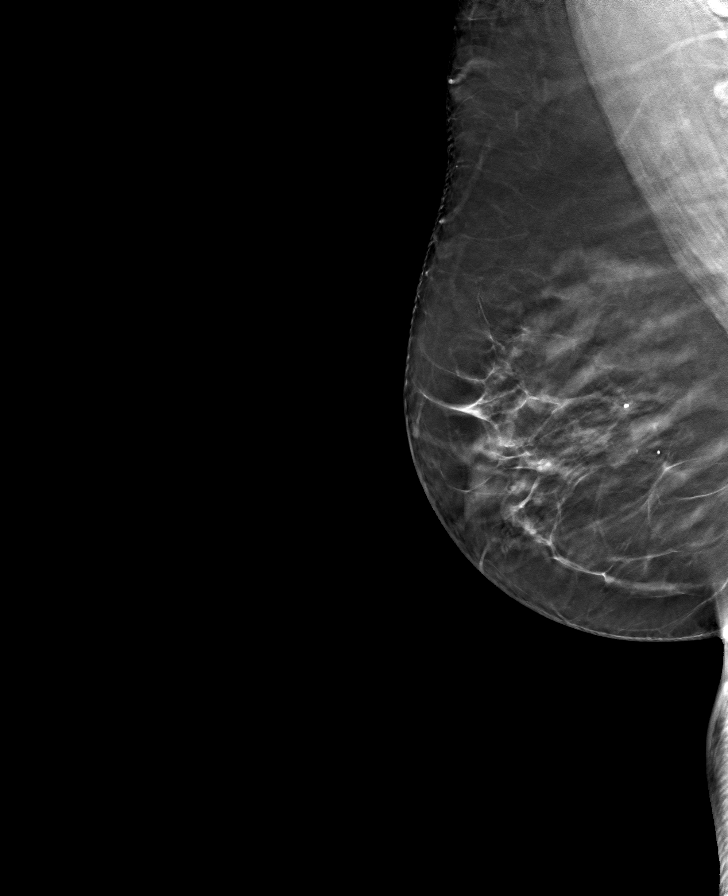

[8 of 24 positions shown; findings below may reference images not displayed]

ACR Breast Density Category c: The breast tissue is heterogeneously
dense, which may obscure small masses.
FINDINGS: There are no findings suspicious for malignancy. Images were
processed with CAD.
IMPRESSION: No mammographic evidence of malignancy. A result letter of this
screening mammogram will be mailed directly to the patient.

RECOMMENDATION:
Screening mammogram in one year. (Code:[id number])

BI-RADS CATEGORY  1: Negative.

## 2019-10-12 ENCOUNTER — Other Ambulatory Visit: Payer: Self-pay | Admitting: Family Medicine

## 2019-10-12 DIAGNOSIS — Z1231 Encounter for screening mammogram for malignant neoplasm of breast: Secondary | ICD-10-CM

## 2019-10-15 ENCOUNTER — Other Ambulatory Visit: Payer: Self-pay

## 2019-10-15 ENCOUNTER — Ambulatory Visit
Admission: RE | Admit: 2019-10-15 | Discharge: 2019-10-15 | Disposition: A | Discharge status: Home / Self Care | Payer: BLUE CROSS/BLUE SHIELD | Source: Ambulatory Visit | Attending: Family Medicine | Admitting: Family Medicine

## 2019-10-15 DIAGNOSIS — Z1231 Encounter for screening mammogram for malignant neoplasm of breast: Secondary | ICD-10-CM

## 2020-09-16 ENCOUNTER — Other Ambulatory Visit: Payer: Self-pay | Admitting: Family Medicine

## 2020-09-16 DIAGNOSIS — Z1231 Encounter for screening mammogram for malignant neoplasm of breast: Secondary | ICD-10-CM

## 2020-10-17 ENCOUNTER — Ambulatory Visit
Admission: RE | Admit: 2020-10-17 | Discharge: 2020-10-17 | Disposition: A | Discharge status: Home / Self Care | Payer: BLUE CROSS/BLUE SHIELD | Source: Ambulatory Visit | Attending: Family Medicine | Admitting: Family Medicine

## 2020-10-17 ENCOUNTER — Other Ambulatory Visit: Payer: Self-pay

## 2020-10-17 DIAGNOSIS — Z1231 Encounter for screening mammogram for malignant neoplasm of breast: Secondary | ICD-10-CM

## 2021-11-24 ENCOUNTER — Other Ambulatory Visit: Payer: Self-pay | Admitting: Family Medicine

## 2021-11-24 DIAGNOSIS — Z1231 Encounter for screening mammogram for malignant neoplasm of breast: Secondary | ICD-10-CM

## 2022-01-01 ENCOUNTER — Ambulatory Visit
Admission: RE | Admit: 2022-01-01 | Discharge: 2022-01-01 | Disposition: A | Discharge status: Home / Self Care | Payer: BLUE CROSS/BLUE SHIELD | Source: Ambulatory Visit | Attending: Family Medicine | Admitting: Family Medicine

## 2022-01-01 DIAGNOSIS — Z1231 Encounter for screening mammogram for malignant neoplasm of breast: Secondary | ICD-10-CM

## 2022-04-16 ENCOUNTER — Encounter: Payer: Self-pay | Admitting: Internal Medicine

## 2022-05-08 ENCOUNTER — Encounter: Payer: Self-pay | Admitting: Internal Medicine

## 2022-05-08 ENCOUNTER — Ambulatory Visit: Payer: BLUE CROSS/BLUE SHIELD | Admitting: Internal Medicine

## 2022-05-08 VITALS — BP 122/78 | HR 83 | Ht 62.0 in | Wt 174.6 lb

## 2022-05-08 DIAGNOSIS — Z1211 Encounter for screening for malignant neoplasm of colon: Secondary | ICD-10-CM | POA: Diagnosis not present

## 2022-05-08 NOTE — Patient Instructions (Signed)
If you are age 55 or older, your body mass index should be between 23-30. Your Body mass index is 31.93 kg/m?Marland Kitchen If this is out of the aforementioned range listed, please consider follow up with your Primary Care Provider. ? ?If you are age 34 or younger, your body mass index should be between 19-25. Your Body mass index is 31.93 kg/m?Marland Kitchen If this is out of the aformentioned range listed, please consider follow up with your Primary Care Provider.  ? ?________________________________________________________ ? ?The [place] providers would like to encourage you to use [facility] to communicate with providers for non-urgent requests or questions.  Due to long hold times on the telephone, sending your provider a message by [facility] may be a faster and more efficient way to get a response.  Please allow 48 business hours for a response.  Please remember that this is for non-urgent requests.  ?_______________________________________________________ ? ?Your provider has ordered Cologuard testing as an option for colon cancer screening. This is performed by [facility] [employer] and may be out of network with your insurance. PRIOR to completing the test, it is YOUR responsibility to contact your insurance about covered benefits for this test. Your out of pocket expense could be anywhere from $0.00 to $649.00.  ? ?When you call to check coverage with your insurer, please provide the following information:  ? ?-The ONLY provider of Cologuard is [facility]  ?- CPT code for Cologuard is [phone].  ?-[employer] NPI # [id number]  ?-[employer] Tax ID # [id number]  ? ?We have already sent your demographic and insurance information to [facility] [employer] [facility] (phone number [phone]) and they should contact you within the next week regarding your test. If you have not heard from them within the next week, please call our office at [phone]. ?  ?

## 2022-05-08 NOTE — Progress Notes (Signed)
? ?  Chief Complaint: Colon cancer screening ? ?HPI : 56 year old female with history of obesity, HLD, and anxiety presents with colon cancer screening ? ?She was encouraged by her PCP to get colon cancer screening. Denies blood in stools, changes in bowel habits, or unintentional weight loss. Denies prior colonoscopy. Denies use of blood thinners. She takes meloxicam and aspirin. Denies fam hx of colon cancer. Denies N&V, dysphagia, abdominal pain. ? ?Past Medical History:  ?Diagnosis Date  ? Anxiety   ? Cervical radiculopathy   ? Depression   ? GERD (gastroesophageal reflux disease)   ? no meds  ? Headache(784.0)   ? Hyperlipidemia   ? Inverted nipple   ? Lumbago   ? ?Past Surgical History:  ?Procedure Laterality Date  ? BONE MARROW BIOPSY  12/24/2009  ? FOOT SURGERY    ? ROTATOR CUFF REPAIR    ? WISDOM TOOTH EXTRACTION  12/24/1984  ? ?Family History  ?Problem Relation Age of Onset  ? Basal cell carcinoma Father   ? Brain cancer Maternal Grandfather   ? Breast cancer Neg Hx   ? Colon cancer Neg Hx   ? Stomach cancer Neg Hx   ? Esophageal cancer Neg Hx   ? ?Social History  ? ?Tobacco Use  ? Smoking status: Every Day  ?  Packs/day: 1.00  ?  Types: Cigarettes  ? Smokeless tobacco: Never  ?Vaping Use  ? Vaping Use: Every day  ?Substance Use Topics  ? Alcohol use: No  ? Drug use: No  ? ?Current Outpatient Medications  ?Medication Sig Dispense Refill  ? amitriptyline (ELAVIL) 25 MG tablet take 1 tablet by mouth at bedtime 30 tablet 2  ? gabapentin (NEURONTIN) 300 MG capsule Take 1 capsule (300 mg total) by mouth 3 (three) times daily. 90 capsule 11  ? meloxicam (MOBIC) 15 MG tablet Take once a day with food for 7 days then prn 40 tablet 0  ? simvastatin (ZOCOR) 20 MG tablet     ? VENTOLIN HFA 108 (90 BASE) MCG/ACT inhaler     ? ?No current facility-administered medications for this visit.  ? ?Allergies  ?Allergen Reactions  ? Metronidazole Swelling  ? Sulfonamide Derivatives Swelling  ? Tramadol Hcl Other (See Comments)   ?  Makes patient disoriented  ? ?Review of Systems: ?All systems reviewed and negative except where noted in HPI.  ? ?Physical Exam: ?BP 122/78   Pulse 83   Ht $R'5\' 2"'xj$  (1.575 m)   Wt 174 lb 9.6 oz (79.2 kg)   SpO2 93%   BMI 31.93 kg/m?  ?Constitutional: Pleasant,well-developed, female in no acute distress. ?HEENT: Normocephalic and atraumatic. Conjunctivae are normal. No scleral icterus. ?Cardiovascular: Normal rate, regular rhythm.  ?Pulmonary/chest: Effort normal and breath sounds normal. No wheezing, rales or rhonchi. ?Abdominal: Soft, nondistended, nontender. Bowel sounds active throughout. There are no masses palpable. No hepatomegaly. ?Extremities: No edema ?Neurological: Alert and oriented to person place and time. ?Skin: Skin is warm and dry. No rashes noted. ?Psychiatric: Normal mood and affect. Behavior is normal. ? ?Labs 12/2021: CBC with elevated WBC of 12.8 (had an URI at that time). CMP nml.  ? ?ASSESSMENT AND PLAN: ?Colon cancer screening ?Patient presents to discuss her colon cancer screening options. Has no concerning symptoms and no fam hx of colon cancer. I went over the options for colon cancer screening including colonoscopy, FIT test, or Cologuard. She opts to proceed with Cologuard. ?- Plan for Cologuard ? ?Christia Reading, MD ? ?

## 2022-05-21 LAB — COLOGUARD: COLOGUARD: NEGATIVE

## 2022-12-14 ENCOUNTER — Other Ambulatory Visit: Payer: Self-pay

## 2022-12-14 DIAGNOSIS — Z1231 Encounter for screening mammogram for malignant neoplasm of breast: Secondary | ICD-10-CM

## 2023-02-05 ENCOUNTER — Ambulatory Visit
Admission: RE | Admit: 2023-02-05 | Discharge: 2023-02-05 | Disposition: A | Discharge status: Home / Self Care | Payer: 59 | Source: Ambulatory Visit | Attending: Family Medicine | Admitting: Family Medicine

## 2023-02-05 DIAGNOSIS — Z1231 Encounter for screening mammogram for malignant neoplasm of breast: Secondary | ICD-10-CM

## 2024-01-27 ENCOUNTER — Other Ambulatory Visit: Payer: Self-pay | Admitting: Family Medicine

## 2024-01-27 DIAGNOSIS — Z1231 Encounter for screening mammogram for malignant neoplasm of breast: Secondary | ICD-10-CM

## 2024-02-11 ENCOUNTER — Ambulatory Visit
Admission: RE | Admit: 2024-02-11 | Discharge: 2024-02-11 | Disposition: A | Discharge status: Home / Self Care | Payer: 59 | Source: Ambulatory Visit | Attending: Family Medicine | Admitting: Family Medicine

## 2024-02-11 DIAGNOSIS — Z1231 Encounter for screening mammogram for malignant neoplasm of breast: Secondary | ICD-10-CM

## 2025-01-26 ENCOUNTER — Other Ambulatory Visit: Payer: Self-pay | Admitting: Physician Assistant

## 2025-01-26 DIAGNOSIS — Z1231 Encounter for screening mammogram for malignant neoplasm of breast: Secondary | ICD-10-CM

## 2025-02-12 ENCOUNTER — Ambulatory Visit
